# Patient Record
Sex: Female | Born: 1990 | Race: Black or African American | Hispanic: No | Marital: Single | Smoking: Current every day smoker
Health system: Southern US, Community
[De-identification: ages and names within clinical notes are randomized; demographics above are authoritative.]

## PROBLEM LIST (undated history)

## (undated) DIAGNOSIS — J45909 Unspecified asthma, uncomplicated: Secondary | ICD-10-CM

---

## 2015-06-26 ENCOUNTER — Emergency Department (HOSPITAL_COMMUNITY): Payer: Self-pay

## 2015-06-26 ENCOUNTER — Encounter (HOSPITAL_COMMUNITY): Payer: Self-pay | Admitting: Emergency Medicine

## 2015-06-26 ENCOUNTER — Emergency Department (HOSPITAL_COMMUNITY)
Admission: EM | Admit: 2015-06-26 | Discharge: 2015-06-26 | Disposition: A | Discharge status: Home / Self Care | Payer: Self-pay | Attending: Emergency Medicine | Admitting: Emergency Medicine

## 2015-06-26 DIAGNOSIS — A599 Trichomoniasis, unspecified: Secondary | ICD-10-CM

## 2015-06-26 DIAGNOSIS — F17200 Nicotine dependence, unspecified, uncomplicated: Secondary | ICD-10-CM | POA: Insufficient documentation

## 2015-06-26 DIAGNOSIS — Z87440 Personal history of urinary (tract) infections: Secondary | ICD-10-CM | POA: Insufficient documentation

## 2015-06-26 DIAGNOSIS — Z3202 Encounter for pregnancy test, result negative: Secondary | ICD-10-CM | POA: Insufficient documentation

## 2015-06-26 DIAGNOSIS — A5901 Trichomonal vulvovaginitis: Secondary | ICD-10-CM | POA: Insufficient documentation

## 2015-06-26 DIAGNOSIS — R112 Nausea with vomiting, unspecified: Secondary | ICD-10-CM | POA: Insufficient documentation

## 2015-06-26 DIAGNOSIS — M549 Dorsalgia, unspecified: Secondary | ICD-10-CM | POA: Insufficient documentation

## 2015-06-26 DIAGNOSIS — R102 Pelvic and perineal pain: Secondary | ICD-10-CM

## 2015-06-26 LAB — CBC WITH DIFFERENTIAL/PLATELET
BASOS ABS: 0 10*3/uL (ref 0.0–0.1)
BASOS PCT: 0 %
Eosinophils Absolute: 0.1 10*3/uL (ref 0.0–0.7)
Eosinophils Relative: 1 %
HEMATOCRIT: 37.3 % (ref 36.0–46.0)
Hemoglobin: 12.2 g/dL (ref 12.0–15.0)
Lymphocytes Relative: 21 %
Lymphs Abs: 1.7 10*3/uL (ref 0.7–4.0)
MCH: 28.3 pg (ref 26.0–34.0)
MCHC: 32.7 g/dL (ref 30.0–36.0)
MCV: 86.5 fL (ref 78.0–100.0)
MONO ABS: 0.5 10*3/uL (ref 0.1–1.0)
Monocytes Relative: 6 %
NEUTROS ABS: 5.6 10*3/uL (ref 1.7–7.7)
NEUTROS PCT: 72 %
PLATELETS: 251 10*3/uL (ref 150–400)
RBC: 4.31 MIL/uL (ref 3.87–5.11)
RDW: 13.3 % (ref 11.5–15.5)
WBC: 7.8 10*3/uL (ref 4.0–10.5)

## 2015-06-26 LAB — URINE MICROSCOPIC-ADD ON: BACTERIA UA: NONE SEEN

## 2015-06-26 LAB — COMPREHENSIVE METABOLIC PANEL
ALBUMIN: 3.7 g/dL (ref 3.5–5.0)
ALT: 25 U/L (ref 14–54)
AST: 20 U/L (ref 15–41)
Alkaline Phosphatase: 55 U/L (ref 38–126)
Anion gap: 6 (ref 5–15)
BILIRUBIN TOTAL: 0.3 mg/dL (ref 0.3–1.2)
BUN: 7 mg/dL (ref 6–20)
CHLORIDE: 105 mmol/L (ref 101–111)
CO2: 26 mmol/L (ref 22–32)
Calcium: 8.9 mg/dL (ref 8.9–10.3)
Creatinine, Ser: 0.64 mg/dL (ref 0.44–1.00)
GFR calc Af Amer: >60 mL/min (ref >60)
GFR calc non Af Amer: >60 mL/min (ref >60)
GLUCOSE: 89 mg/dL (ref 65–99)
POTASSIUM: 3.7 mmol/L (ref 3.5–5.1)
SODIUM: 137 mmol/L (ref 135–145)
Total Protein: 7 g/dL (ref 6.5–8.1)

## 2015-06-26 LAB — URINALYSIS, ROUTINE W REFLEX MICROSCOPIC
Bilirubin Urine: NEGATIVE
GLUCOSE, UA: NEGATIVE mg/dL
Ketones, ur: NEGATIVE mg/dL
Nitrite: NEGATIVE
Protein, ur: NEGATIVE mg/dL
SPECIFIC GRAVITY, URINE: 1.019 (ref 1.005–1.030)
pH: 7.5 (ref 5.0–8.0)

## 2015-06-26 LAB — WET PREP, GENITAL
SPERM: NONE SEEN
Yeast Wet Prep HPF POC: NONE SEEN

## 2015-06-26 LAB — POC URINE PREG, ED: PREG TEST UR: NEGATIVE

## 2015-06-26 MED ORDER — DOXYCYCLINE HYCLATE 100 MG PO CAPS: 100.0000 mg | ORAL_CAPSULE | Freq: Two times a day (BID) | ORAL | Status: DC

## 2015-06-26 MED ORDER — METRONIDAZOLE 500 MG PO TABS: 500.0000 mg | ORAL_TABLET | Freq: Two times a day (BID) | ORAL | Status: DC

## 2015-06-26 MED ORDER — CEFTRIAXONE SODIUM 250 MG IJ SOLR
250.0000 mg | Freq: Once | INTRAMUSCULAR | Status: AC
  Administered 2015-06-26: 250 mg via INTRAMUSCULAR
  Filled 2015-06-26: qty 250

## 2015-06-26 NOTE — ED Notes (Signed)
Pt sts lowe to mid abd pain into back; pt sts hx of kidney infection and is currently on period but feels worse than menstrual cramps

## 2015-06-26 NOTE — ED Provider Notes (Signed)
CSN: [id number]     Arrival date & time 06/26/15  1116 History   First MD Initiated Contact with Patient 06/26/15 1551     Chief Complaint  Patient presents with  . Abdominal Pain    (Consider location/radiation/quality/duration/timing/severity/associated sxs/prior Treatment) HPI Comments: Patient with history of urinary infection presents with complaint of lower abdominal pain with radiation into her right back starting yesterday. Pain began gradually yesterday and gradually became worse today. It was associated with nausea and vomiting 1 this morning. Patient started her menstrual. 2 days ago, however does not feel like her current pain is similar to previous menstrual cramps. No history of abdominal surgeries. No fevers, diarrhea. No treatments prior to arrival. Onset of symptoms acute. Nothing makes symptoms better or worse. Last sexual activity 5 weeks ago.   Patient is a 24 y.o. female presenting with abdominal pain. The history is provided by the patient.  Abdominal Pain Associated symptoms: nausea, vaginal bleeding and vomiting   Associated symptoms: no chest pain, no cough, no diarrhea, no dysuria, no fever and no sore throat     History reviewed. No pertinent past medical history. History reviewed. No pertinent past surgical history. History reviewed. No pertinent family history. Social History  Substance Use Topics  . Smoking status: Current Every Day Smoker  . Smokeless tobacco: None  . Alcohol Use: Yes   OB History    No data available     Review of Systems  Constitutional: Negative for fever.  HENT: Negative for rhinorrhea and sore throat.   Eyes: Negative for redness.  Respiratory: Negative for cough.   Cardiovascular: Negative for chest pain.  Gastrointestinal: Positive for nausea, vomiting and abdominal pain. Negative for diarrhea.  Genitourinary: Positive for vaginal bleeding. Negative for dysuria.  Musculoskeletal: Positive for back pain. Negative for  myalgias.  Skin: Negative for rash.  Neurological: Negative for headaches.      Allergies  Review of patient's allergies indicates no known allergies.  Home Medications   Prior to Admission medications   Medication Sig Start Date End Date Taking? Authorizing Provider  acetaminophen (TYLENOL) 500 MG tablet Take 500 mg by mouth every 6 (six) hours as needed for moderate pain.   Yes Historical Provider, MD   BP 133/101 mmHg  Pulse 70  Temp(Src) 98.2 F (36.8 C) (Oral)  Resp 18  SpO2 100%   Physical Exam  Constitutional: She appears well-developed and well-nourished.  HENT:  Head: Normocephalic and atraumatic.  Eyes: Conjunctivae are normal. Right eye exhibits no discharge. Left eye exhibits no discharge.  Neck: Normal range of motion. Neck supple.  Cardiovascular: Normal rate, regular rhythm and normal heart sounds.   Pulmonary/Chest: Effort normal and breath sounds normal.  Abdominal: Soft. She exhibits no distension. There is tenderness (Lower abdomen, right greater than left). There is no rebound and no guarding.  No upper abdominal tenderness.  Genitourinary: There is no rash, tenderness or lesion on the right labia. There is no rash, tenderness or lesion on the left labia. Uterus is tender (mild). Cervix exhibits no motion tenderness and no discharge. Right adnexum displays tenderness (tenderness). Right adnexum displays no mass and no fullness. Left adnexum displays no mass, no tenderness and no fullness. There is bleeding in the vagina. No tenderness in the vagina.  Neurological: She is alert.  Skin: Skin is warm and dry.  Psychiatric: She has a normal mood and affect.  Nursing note and vitals reviewed.   ED Course  Procedures (including critical care time) Labs  Review Labs Reviewed  WET PREP, GENITAL - Abnormal; Notable for the following:    Trich, Wet Prep PRESENT (*)    Clue Cells Wet Prep HPF POC PRESENT (*)    WBC, Wet Prep HPF POC MODERATE (*)    All other  components within normal limits  URINALYSIS, ROUTINE W REFLEX MICROSCOPIC (NOT AT [facility]) - Abnormal; Notable for the following:    Color, Urine RED (*)    APPearance TURBID (*)    Hgb urine dipstick LARGE (*)    Leukocytes, UA MODERATE (*)    All other components within normal limits  URINE MICROSCOPIC-ADD ON - Abnormal; Notable for the following:    Squamous Epithelial / LPF 0-5 (*)    All other components within normal limits  CBC WITH DIFFERENTIAL/PLATELET  COMPREHENSIVE METABOLIC PANEL  POC URINE PREG, ED  GC/CHLAMYDIA PROBE AMP ([place]) NOT AT [facility]    Imaging Review US Transvaginal Non-ob  06/26/2015  CLINICAL DATA:  Right adnexal pain for 4 days EXAM: TRANSABDOMINAL AND TRANSVAGINAL ULTRASOUND OF PELVIS TECHNIQUE: Both transabdominal and transvaginal ultrasound examinations of the pelvis were performed. Transabdominal technique was performed for global imaging of the pelvis including uterus, ovaries, adnexal regions, and pelvic cul-de-sac. It was necessary to proceed with endovaginal exam following the transabdominal exam to visualize the uterus, endometrium and ovaries. COMPARISON:  None FINDINGS: Uterus Measurements: 6.5 x 3.7 x 4.5 cm. No fibroids or other mass visualized. Endometrium Thickness:  4.4 mm.  No focal abnormality visualized. Right ovary Measurements: 3.9 x 2.2 x 1.9 cm. Normal appearance/no adnexal mass. Left ovary Measurements: 4.1 x 2.8 x 2.8 cm. Probable corpus luteum is identified within the left ovary measuring 2.3 x 1.2 x 1.4 cm. Normal appearance/no adnexal mass. Other findings No free fluid. IMPRESSION: 1. Left ovary corpus luteum. 2. No findings to explain right adnexal pain. Electronically Signed   By: Signa Kell M.D.   On: 06/26/2015 18:06   US Pelvis Complete  06/26/2015  CLINICAL DATA:  Right adnexal pain for 4 days EXAM: TRANSABDOMINAL AND TRANSVAGINAL ULTRASOUND OF PELVIS TECHNIQUE: Both transabdominal and transvaginal ultrasound examinations of the  pelvis were performed. Transabdominal technique was performed for global imaging of the pelvis including uterus, ovaries, adnexal regions, and pelvic cul-de-sac. It was necessary to proceed with endovaginal exam following the transabdominal exam to visualize the uterus, endometrium and ovaries. COMPARISON:  None FINDINGS: Uterus Measurements: 6.5 x 3.7 x 4.5 cm. No fibroids or other mass visualized. Endometrium Thickness:  4.4 mm.  No focal abnormality visualized. Right ovary Measurements: 3.9 x 2.2 x 1.9 cm. Normal appearance/no adnexal mass. Left ovary Measurements: 4.1 x 2.8 x 2.8 cm. Probable corpus luteum is identified within the left ovary measuring 2.3 x 1.2 x 1.4 cm. Normal appearance/no adnexal mass. Other findings No free fluid. IMPRESSION: 1. Left ovary corpus luteum. 2. No findings to explain right adnexal pain. Electronically Signed   By: Signa Kell M.D.   On: 06/26/2015 18:06   I have personally reviewed and evaluated these images and lab results as part of my medical decision-making.   EKG Interpretation None       4:07 PM Patient seen and examined. Work-up initiated. Offered pain mediations, patient declines.   Vital signs reviewed and are as follows: BP 133/101 mmHg  Pulse 70  Temp(Src) 98.2 F (36.8 C) (Oral)  Resp 18  SpO2 100%  Pelvic exam performed with NT chaperone. [place] ordered given R adnexal tenderness.   7:20 PM patient updated  on pelvic ultrasound results. Wet prep shows Trichomonas. Patient informed and counseled on this. Given mild right-sided adnexal tenderness, cannot rule out pelvic inflammatory disease, although mild. Will treat here with Rocephin. Will give Flagyl and doxycycline for home. Encouraged follow-up with OB/GYN. Referral given.  The patient was urged to return to the Emergency Department immediately with worsening of current symptoms, worsening abdominal pain, persistent vomiting, blood noted in stools, fever, or any other concerns. The patient  verbalized understanding.    MDM   Final diagnoses:  Trichomoniasis   Patient with pelvic pain, right greater than left. With blood cell count is normal. Low suspicion for appendicitis. No free fluid noted on ultrasound. Workup is reassuring. Given adnexal tenderness plus trichomoniasis, will treat for PID. UA shows 0-5 red blood cells, no bacteria, do not suspect urinary tract infection or pyelonephritis.    Renne Crigler, PA-C 06/26/15 [id number]  Dione Booze, MD 06/26/15 [phone]

## 2015-06-26 NOTE — Discharge Instructions (Signed)
Please read and follow all provided instructions.  Your diagnoses today include:  1. Pelvic pain in female   2. Trichomoniasis     Tests performed today include:  Blood counts and electrolytes  Blood tests to check liver and kidney function  Urine test to look for infection and pregnancy (in women)  Ultrasound - normal  Wet prep - shows trichomonas  Vital signs. See below for your results today.   Medications prescribed:   Doxycycline - antibiotic  You have been prescribed an antibiotic medicine: take the entire course of medicine even if you are feeling better. Stopping early can cause the antibiotic not to work.   Metronidazole - antibiotic  You have been prescribed an antibiotic medicine: take the entire course of medicine even if you are feeling better. Stopping early can cause the antibiotic not to work. Do not drink alcohol when taking this medication.   Take any prescribed medications only as directed.  Home care instructions:   Follow any educational materials contained in this packet.  Follow-up instructions: Please follow-up with your OB/GYN for further evaluation of your symptoms.    Return instructions:  SEEK IMMEDIATE MEDICAL ATTENTION IF:  The pain does not go away or becomes severe   A temperature above 101F develops   Repeated vomiting occurs (multiple episodes)   The pain becomes localized to portions of the abdomen. The right side could possibly be appendicitis. In an adult, the left lower portion of the abdomen could be colitis or diverticulitis.   Blood is being passed in stools or vomit (bright red or black tarry stools)   You develop chest pain, difficulty breathing, dizziness or fainting, or become confused, poorly responsive, or inconsolable (young children)  If you have any other emergent concerns regarding your health  Additional Information: Abdominal (belly) pain can be caused by many things. Your caregiver performed an  examination and possibly ordered blood/urine tests and imaging (CT scan, x-rays, ultrasound). Many cases can be observed and treated at home after initial evaluation in the emergency department. Even though you are being discharged home, abdominal pain can be unpredictable. Therefore, you need a repeated exam if your pain does not resolve, returns, or worsens. Most patients with abdominal pain don't have to be admitted to the hospital or have surgery, but serious problems like appendicitis and gallbladder attacks can start out as nonspecific pain. Many abdominal conditions cannot be diagnosed in one visit, so follow-up evaluations are very important.  Your vital signs today were: BP 123/99 mmHg   Pulse 73   Temp(Src) 98.2 F (36.8 C) (Oral)   Resp 18   SpO2 100% If your blood pressure (bp) was elevated above 135/85 this visit, please have this repeated by your doctor within one month. --------------

## 2015-06-27 LAB — GC/CHLAMYDIA PROBE AMP (~~LOC~~) NOT AT ARMC
CHLAMYDIA, DNA PROBE: NEGATIVE
Neisseria Gonorrhea: NEGATIVE

## 2016-06-22 IMAGING — US US TRANSVAGINAL NON-OB
1 series · 14 of 25 positions shown · non-contrast
Comparison: None

CLINICAL DATA: Right adnexal pain for 4 days

EXAM:
TRANSABDOMINAL AND TRANSVAGINAL ULTRASOUND OF PELVIS
TECHNIQUE: Both transabdominal and transvaginal ultrasound examinations of the
pelvis were performed. Transabdominal technique was performed for
global imaging of the pelvis including uterus, ovaries, adnexal
regions, and pelvic cul-de-sac. It was necessary to proceed with
endovaginal exam following the transabdominal exam to visualize the
uterus, endometrium and ovaries..

[Series 1: us transvaginal non-ob · 0.24mm/px · 14 of 86 slices shown]
[im 1/86]
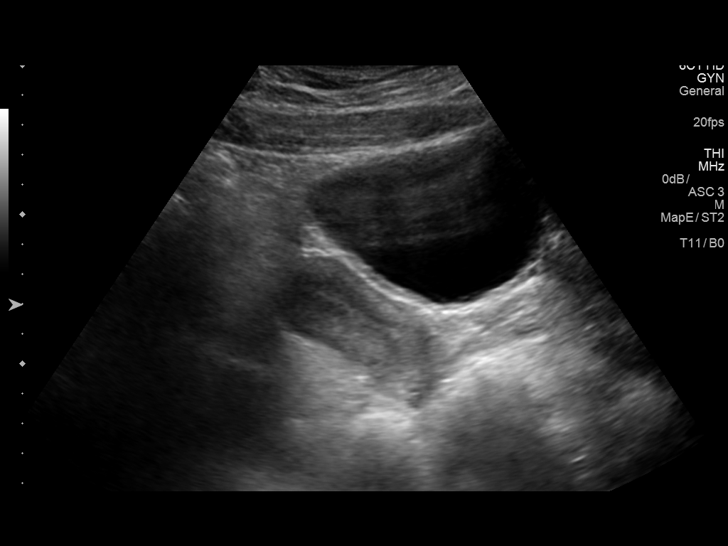
[im 8/86]
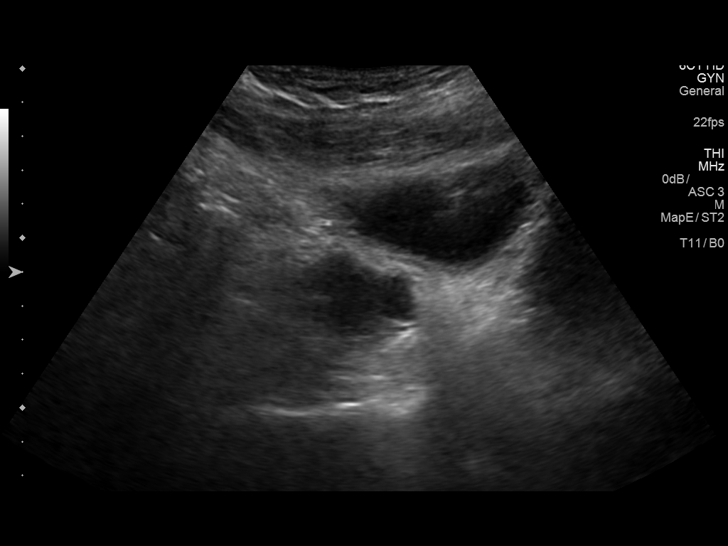
[im 15/86]
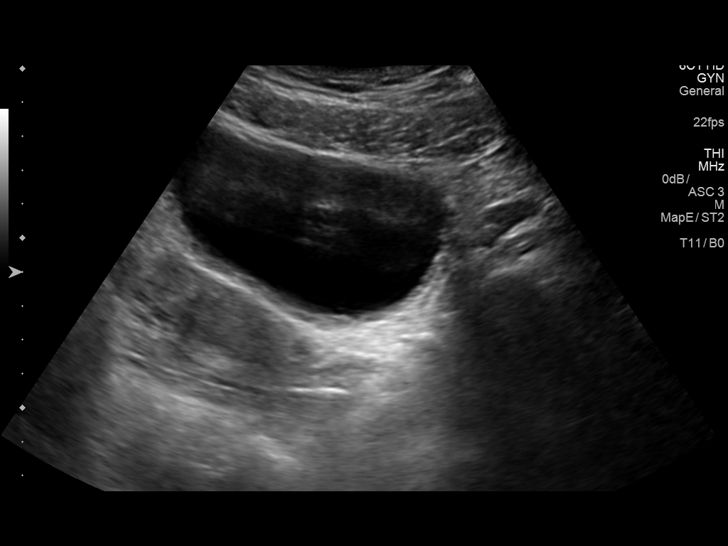
[im 22/86]
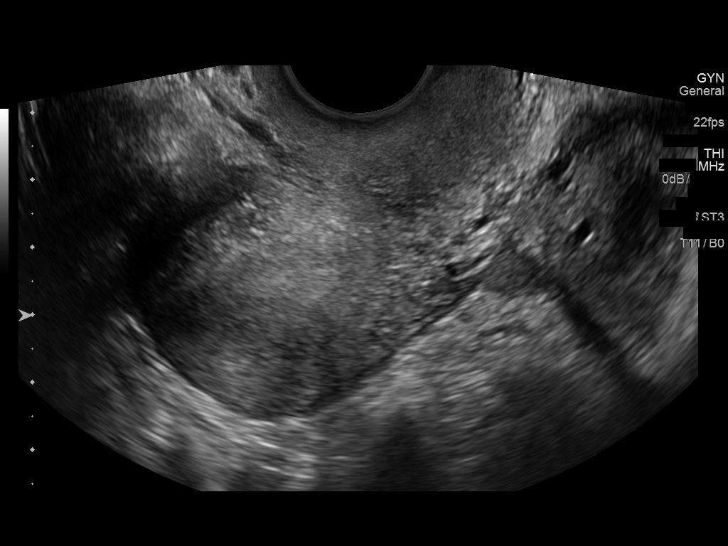
[im 29/86]
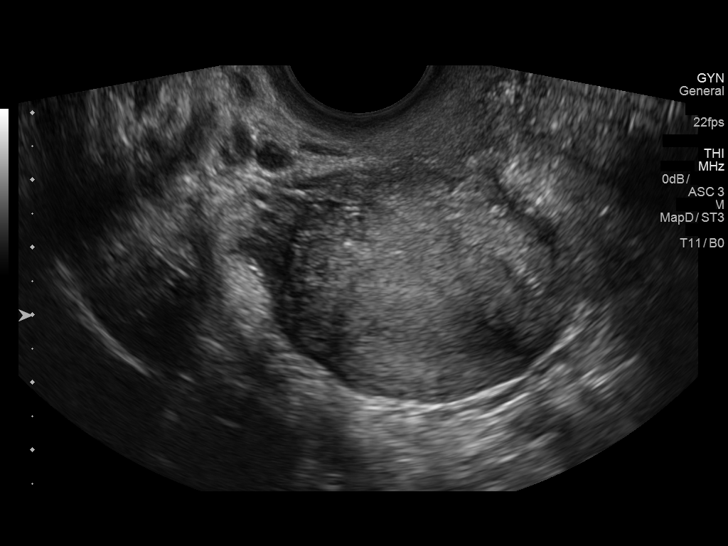
[im 32/86]
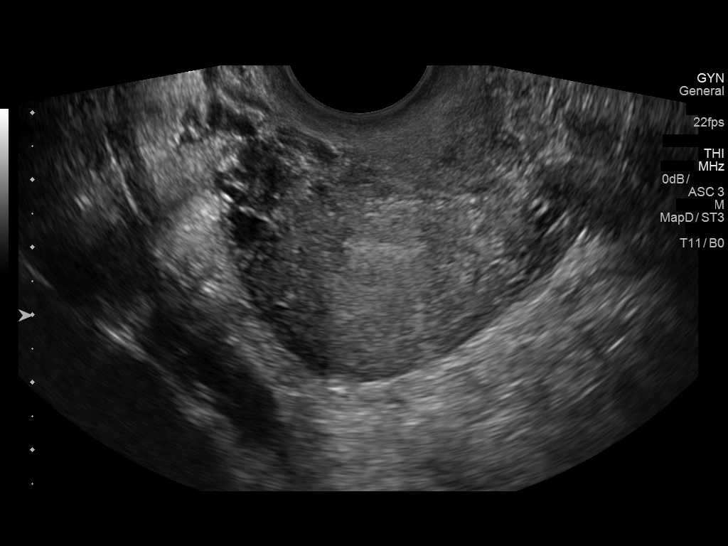
[im 39/86]
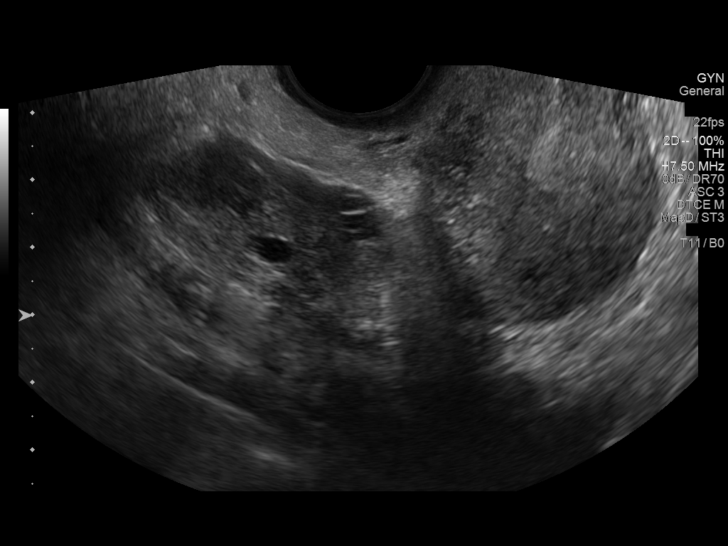
[im 47/86]
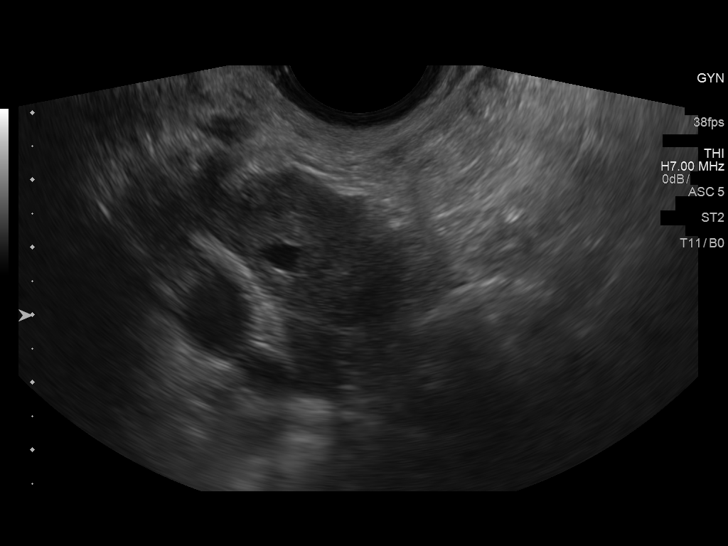
[im 54/86]
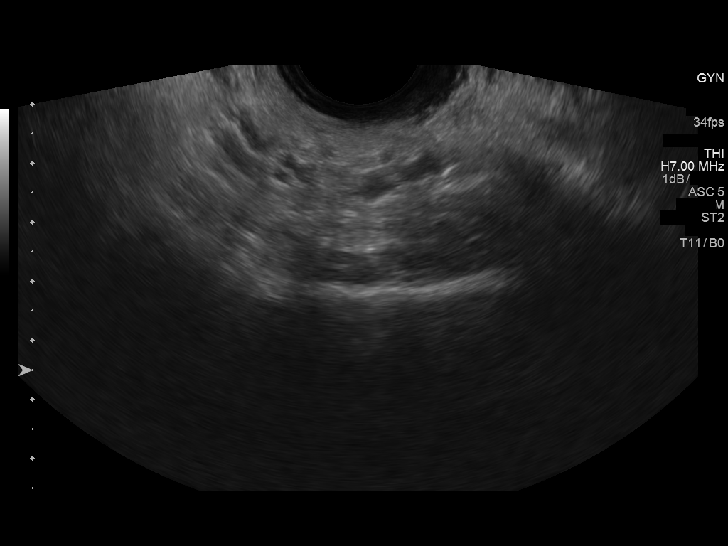
[im 57/86]
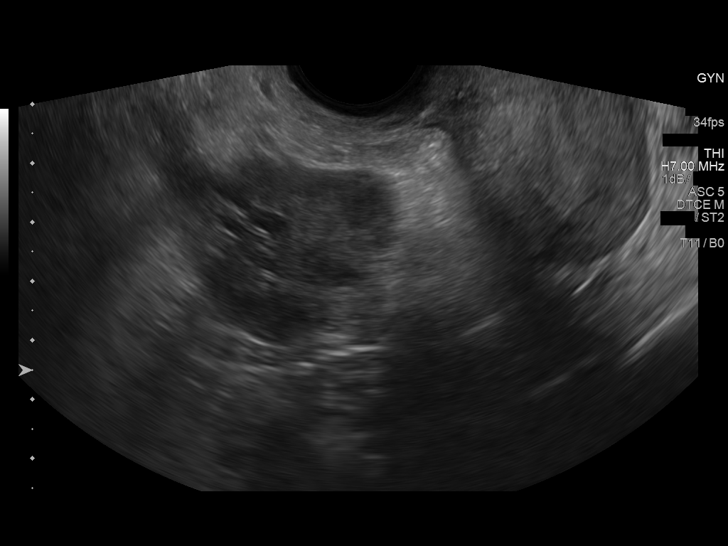
[im 64/86]
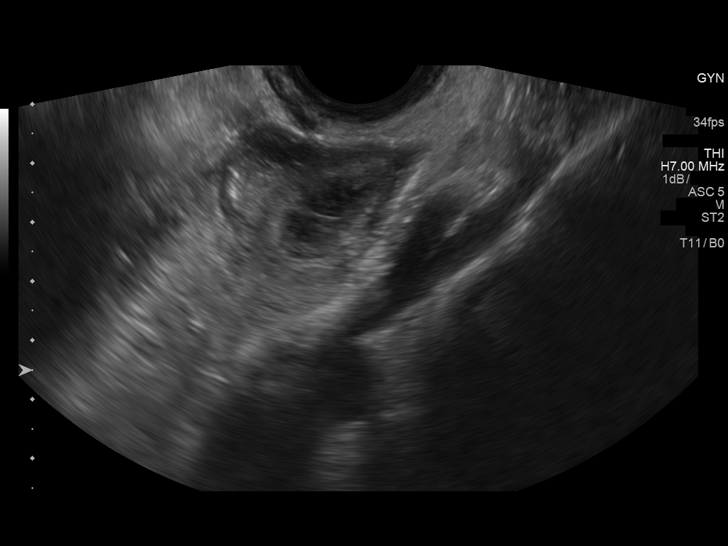
[im 71/86]
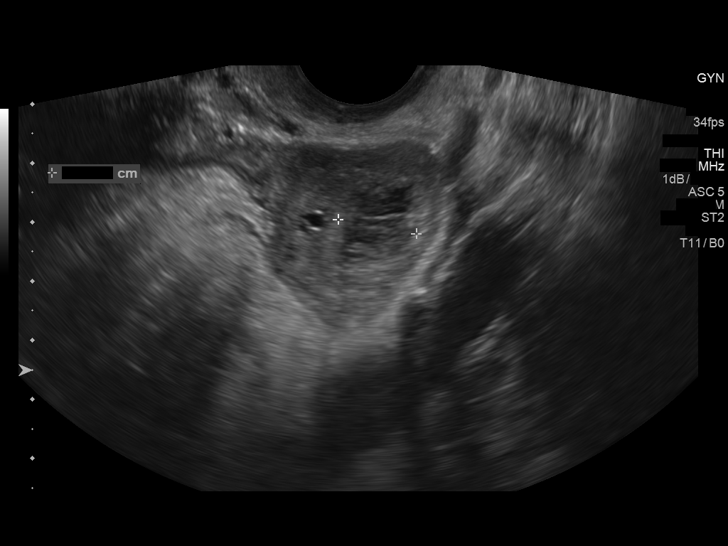
[im 78/86]
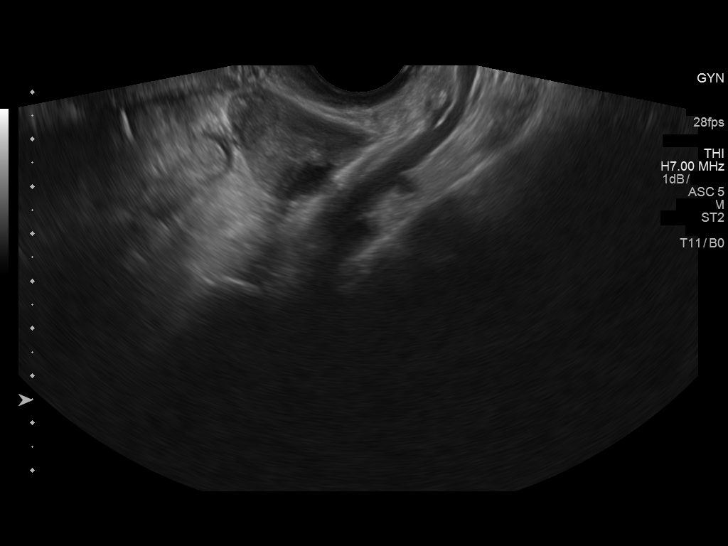
[im 86/86]
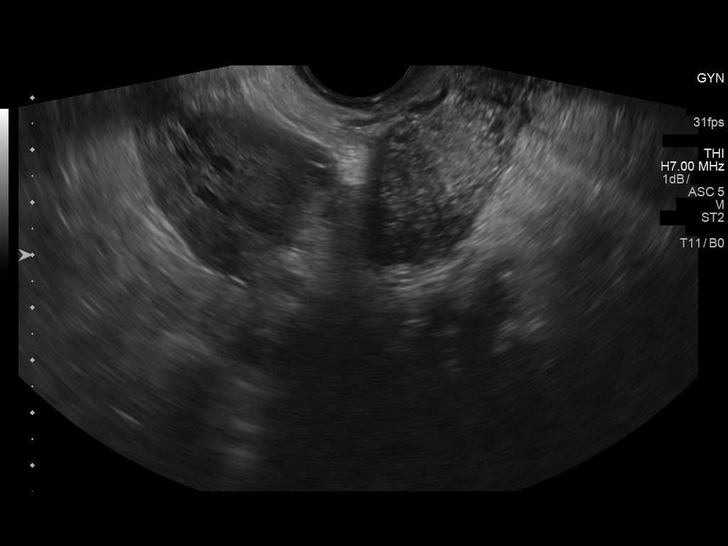

[14 of 25 positions shown; findings below may reference images not displayed]

FINDINGS: Uterus

Measurements: 6.5 x 3.7 x 4.5 cm. No fibroids or other mass
visualized.

Endometrium

Thickness:  4.4 mm.  No focal abnormality visualized.

Right ovary

Measurements: 3.9 x 2.2 x 1.9 cm. Normal appearance/no adnexal mass.

Left ovary

Measurements: 4.1 x 2.8 x 2.8 cm. Probable corpus luteum is
identified within the left ovary measuring 2.3 x 1.2 x 1.4 cm..
Normal appearance/no adnexal mass.

Other findings

No free fluid.
IMPRESSION: 1. Left ovary corpus luteum.
2. No findings to explain right adnexal pain.

## 2020-03-22 ENCOUNTER — Encounter (HOSPITAL_COMMUNITY): Payer: Self-pay

## 2020-03-22 ENCOUNTER — Other Ambulatory Visit: Payer: Self-pay

## 2020-03-22 ENCOUNTER — Emergency Department (HOSPITAL_COMMUNITY)
Admission: EM | Admit: 2020-03-22 | Discharge: 2020-03-22 | Disposition: A | Discharge status: Home / Self Care | Payer: BLUE CROSS/BLUE SHIELD | Attending: Emergency Medicine | Admitting: Emergency Medicine

## 2020-03-22 DIAGNOSIS — S01511A Laceration without foreign body of lip, initial encounter: Secondary | ICD-10-CM | POA: Diagnosis not present

## 2020-03-22 DIAGNOSIS — Y92013 Bedroom of single-family (private) house as the place of occurrence of the external cause: Secondary | ICD-10-CM | POA: Insufficient documentation

## 2020-03-22 DIAGNOSIS — Z23 Encounter for immunization: Secondary | ICD-10-CM | POA: Diagnosis not present

## 2020-03-22 DIAGNOSIS — F17210 Nicotine dependence, cigarettes, uncomplicated: Secondary | ICD-10-CM | POA: Diagnosis not present

## 2020-03-22 DIAGNOSIS — J45909 Unspecified asthma, uncomplicated: Secondary | ICD-10-CM | POA: Diagnosis not present

## 2020-03-22 DIAGNOSIS — S00501A Unspecified superficial injury of lip, initial encounter: Secondary | ICD-10-CM | POA: Diagnosis present

## 2020-03-22 HISTORY — DX: Unspecified asthma, uncomplicated: J45.909

## 2020-03-22 MED ORDER — LIDOCAINE HCL (PF) 1 % IJ SOLN
5.0000 mL | Freq: Once | INTRAMUSCULAR | Status: AC
  Administered 2020-03-22: 5 mL
  Filled 2020-03-22: qty 30

## 2020-03-22 MED ORDER — TETANUS-DIPHTH-ACELL PERTUSSIS 5-2.5-18.5 LF-MCG/0.5 IM SUSP
0.5000 mL | Freq: Once | INTRAMUSCULAR | Status: AC
  Administered 2020-03-22: 0.5 mL via INTRAMUSCULAR
  Filled 2020-03-22: qty 0.5
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
## 2020-03-22 NOTE — Discharge Instructions (Addendum)
Keep area clean and dry.  Recommend soft foods for at least the next week to 2 weeks.  If you develop any significant swelling, redness, drainage, return to ER for wound check.
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
## 2020-09-02 ENCOUNTER — Ambulatory Visit (HOSPITAL_COMMUNITY)
Admission: EM | Admit: 2020-09-02 | Discharge: 2020-09-02 | Disposition: A | Discharge status: Home / Self Care | Payer: HRSA Program | Attending: Family Medicine | Admitting: Family Medicine

## 2020-09-02 ENCOUNTER — Other Ambulatory Visit: Payer: Self-pay

## 2020-09-02 ENCOUNTER — Encounter (HOSPITAL_COMMUNITY): Payer: Self-pay | Admitting: Emergency Medicine

## 2020-09-02 DIAGNOSIS — R110 Nausea: Secondary | ICD-10-CM | POA: Diagnosis not present

## 2020-09-02 DIAGNOSIS — Z20822 Contact with and (suspected) exposure to covid-19: Secondary | ICD-10-CM | POA: Insufficient documentation

## 2020-09-02 DIAGNOSIS — J069 Acute upper respiratory infection, unspecified: Secondary | ICD-10-CM | POA: Insufficient documentation

## 2020-09-02 DIAGNOSIS — R059 Cough, unspecified: Secondary | ICD-10-CM | POA: Insufficient documentation

## 2020-09-02 DIAGNOSIS — F17210 Nicotine dependence, cigarettes, uncomplicated: Secondary | ICD-10-CM | POA: Diagnosis not present

## 2020-09-02 NOTE — ED Provider Notes (Signed)
  [facility]ARE [facility]ENTER   [id number] 09/02/20 Arrival Time: 1250  ASSESSMENT & PLAN:  1. Viral URI with cough     COVID-19 testing sent. See letter/work note on file for self-isolation guidelines. OTC symptom care as needed. May f/u here as needed.   Reviewed expectations re: course of current medical issues. Questions answered. Outlined signs and symptoms indicating need for more acute intervention. Understanding verbalized. After Visit Summary given.   SUBJECTIVE: History from: patient. Jacqueline Wang is a 30 y.o. female who presents with worries regarding COVID-19. Known COVID-19 contact: family member. Recent travel: none. Reports: cough, runny nose, mild nausea without emesis, fatigue; abrupt onset; few d ago. Denies: fever and difficulty breathing. Normal PO intake without n/v/d.    OBJECTIVE:  Vitals:   09/02/20 1344  BP: 136/87  Pulse: 97  Resp: 18  Temp: 98.9 F (37.2 C)  TempSrc: Oral  SpO2: 100%    General appearance: alert; no distress Eyes: PERRLA; EOMI; conjunctiva normal HENT: [place]; AT; with mild nasal congestion Neck: supple  Lungs: speaks full sentences without difficulty; unlabored Extremities: no edema Skin: warm and dry Neurologic: normal gait Psychological: alert and cooperative; normal mood and affect  Labs:  Labs Reviewed  SARS CORONAVIRUS 2 (TAT 6-24 HRS)    No Known Allergies  Past Medical History:  Diagnosis Date  . Asthma    Social History   Socioeconomic History  . Marital status: Single    Spouse name: Not on file  . Number of children: Not on file  . Years of education: Not on file  . Highest education level: Not on file  Occupational History  . Not on file  Tobacco Use  . Smoking status: Current Every Day Smoker    Packs/day: 0.35    Types: Cigarettes  . Smokeless tobacco: Never Used  Vaping Use  . Vaping Use: Never used  Substance and Sexual Activity  . Alcohol use: Yes  . Drug use: Yes    Types: Marijuana   . Sexual activity: Not on file  Other Topics Concern  . Not on file  Social History Narrative  . Not on file   Social Determinants of Health   Financial Resource Strain: Not on file  Food Insecurity: Not on file  Transportation Needs: Not on file  Physical Activity: Not on file  Stress: Not on file  Social Connections: Not on file  Intimate Partner Violence: Not on file   History reviewed. No pertinent family history. History reviewed. No pertinent surgical history.   Mardella Layman, MD 09/02/20 1410

## 2020-09-02 NOTE — Discharge Instructions (Addendum)
You have been tested for COVID-19 today. °If your test returns positive, you will receive a phone call from [facility] regarding your results. °Negative test results are not called. °Both positive and negative results area always visible on MyChart. °If you do not have a MyChart account, sign up instructions are provided in your discharge papers. °Please do not hesitate to contact us should you have questions or concerns. ° °

## 2020-09-02 NOTE — ED Triage Notes (Signed)
Symptoms started 2 days ago.  Complains of cough, runny nose, nausea, and fatigue.  Patient complains of slight sob

## 2020-09-02 NOTE — ED Notes (Signed)
Patient on phone, requesting time to take care of call

## 2020-09-03 LAB — SARS CORONAVIRUS 2 (TAT 6-24 HRS): SARS Coronavirus 2: NEGATIVE

## 2020-11-19 ENCOUNTER — Emergency Department (HOSPITAL_COMMUNITY): Payer: Self-pay

## 2020-11-19 ENCOUNTER — Encounter (HOSPITAL_COMMUNITY): Payer: Self-pay

## 2020-11-19 ENCOUNTER — Emergency Department (HOSPITAL_COMMUNITY)
Admission: EM | Admit: 2020-11-19 | Discharge: 2020-11-19 | Disposition: A | Discharge status: Home / Self Care | Payer: Self-pay | Attending: Emergency Medicine | Admitting: Emergency Medicine

## 2020-11-19 ENCOUNTER — Other Ambulatory Visit: Payer: Self-pay

## 2020-11-19 DIAGNOSIS — N83201 Unspecified ovarian cyst, right side: Secondary | ICD-10-CM

## 2020-11-19 DIAGNOSIS — R1032 Left lower quadrant pain: Secondary | ICD-10-CM

## 2020-11-19 DIAGNOSIS — F17210 Nicotine dependence, cigarettes, uncomplicated: Secondary | ICD-10-CM | POA: Insufficient documentation

## 2020-11-19 DIAGNOSIS — A599 Trichomoniasis, unspecified: Secondary | ICD-10-CM | POA: Insufficient documentation

## 2020-11-19 DIAGNOSIS — J45909 Unspecified asthma, uncomplicated: Secondary | ICD-10-CM | POA: Insufficient documentation

## 2020-11-19 DIAGNOSIS — N83291 Other ovarian cyst, right side: Secondary | ICD-10-CM | POA: Insufficient documentation

## 2020-11-19 LAB — COMPREHENSIVE METABOLIC PANEL
ALT: 23 U/L (ref 0–44)
AST: 16 U/L (ref 15–41)
Albumin: 3.7 g/dL (ref 3.5–5.0)
Alkaline Phosphatase: 57 U/L (ref 38–126)
Anion gap: 6 (ref 5–15)
BUN: 5 mg/dL — ABNORMAL LOW (ref 6–20)
CO2: 24 mmol/L (ref 22–32)
Calcium: 8.9 mg/dL (ref 8.9–10.3)
Chloride: 103 mmol/L (ref 98–111)
Creatinine, Ser: 0.64 mg/dL (ref 0.44–1.00)
GFR, Estimated: >60 mL/min (ref >60)
Glucose, Bld: 84 mg/dL (ref 70–99)
Potassium: 4 mmol/L (ref 3.5–5.1)
Sodium: 133 mmol/L — ABNORMAL LOW (ref 135–145)
Total Bilirubin: 0.3 mg/dL (ref 0.3–1.2)
Total Protein: 7.2 g/dL (ref 6.5–8.1)

## 2020-11-19 LAB — WET PREP, GENITAL
Clue Cells Wet Prep HPF POC: NONE SEEN
Sperm: NONE SEEN
Yeast Wet Prep HPF POC: NONE SEEN

## 2020-11-19 LAB — URINALYSIS, ROUTINE W REFLEX MICROSCOPIC
Bilirubin Urine: NEGATIVE
Glucose, UA: NEGATIVE mg/dL
Hgb urine dipstick: NEGATIVE
Ketones, ur: NEGATIVE mg/dL
Nitrite: NEGATIVE
Protein, ur: NEGATIVE mg/dL
Specific Gravity, Urine: 1.016 (ref 1.005–1.030)
pH: 7 (ref 5.0–8.0)

## 2020-11-19 LAB — I-STAT BETA HCG BLOOD, ED (MC, WL, AP ONLY): I-stat hCG, quantitative: <5 m[IU]/mL (ref <5)

## 2020-11-19 LAB — CBC
HCT: 43.3 % (ref 36.0–46.0)
Hemoglobin: 13.8 g/dL (ref 12.0–15.0)
MCH: 28 pg (ref 26.0–34.0)
MCHC: 31.9 g/dL (ref 30.0–36.0)
MCV: 87.8 fL (ref 80.0–100.0)
Platelets: 207 10*3/uL (ref 150–400)
RBC: 4.93 MIL/uL (ref 3.87–5.11)
RDW: 12.4 % (ref 11.5–15.5)
WBC: 10.3 10*3/uL (ref 4.0–10.5)
nRBC: 0 % (ref 0.0–0.2)

## 2020-11-19 LAB — LIPASE, BLOOD: Lipase: 21 U/L (ref 11–51)

## 2020-11-19 MED ORDER — DOXYCYCLINE HYCLATE 100 MG PO CAPS: 100.0000 mg | ORAL_CAPSULE | Freq: Two times a day (BID) | ORAL | 0 refills | Status: AC

## 2020-11-19 MED ORDER — IBUPROFEN 800 MG PO TABS
800.0000 mg | ORAL_TABLET | Freq: Once | ORAL | Status: AC
  Administered 2020-11-19: 800 mg via ORAL
  Filled 2020-11-19: qty 1

## 2020-11-19 MED ORDER — ACETAMINOPHEN 325 MG PO TABS
650.0000 mg | ORAL_TABLET | Freq: Once | ORAL | Status: AC
  Administered 2020-11-19: 650 mg via ORAL
  Filled 2020-11-19: qty 2

## 2020-11-19 MED ORDER — METRONIDAZOLE 500 MG PO TABS: 500.0000 mg | ORAL_TABLET | Freq: Two times a day (BID) | ORAL | 0 refills | Status: AC

## 2020-11-19 MED ORDER — CEFTRIAXONE SODIUM 500 MG IJ SOLR
500.0000 mg | Freq: Once | INTRAMUSCULAR | Status: AC
  Administered 2020-11-19: 500 mg via INTRAMUSCULAR
  Filled 2020-11-19: qty 500

## 2020-11-19 NOTE — ED Notes (Signed)
Patient transported to ultrasound.

## 2020-11-19 NOTE — ED Triage Notes (Signed)
Pt reports LLQ abd pain x3 days.Pt reports N&V. Pt denies any diarrhea.

## 2020-11-19 NOTE — ED Provider Notes (Signed)
[facility] EMERGENCY DEPARTMENT Provider Note   CSN: [id number] Arrival date & time: 11/19/20  1131     History Chief Complaint  Patient presents with  . Abdominal Pain    Jacqueline Wang is a 30 y.o. female presenting for evaluation of progressive lower abdominal pain.   She reports this initially started with abdominal cramping on Sunday, 5/1, afternoon while she was at work.  She initially attributed this to menstrual cramps as she is due to start her cycle in the next several days, however the pain persisted and became more severe since yesterday.  Squeezing in nature mainly in her suprapubic region, slightly to the right as well.  Constant.  Worse when she lays flat or with eating.  Feeling nauseous due to the discomfort but no emesis.  Has been eating and drinking, however slightly less.  Last bowel movement was on Sunday and was normal for her.  She reports a kidney infection in the past for which she had a similar pain, however that was more severe than current.  Denies any associated fever, dysuria, urinary frequency, or urgency.  Has had change in vaginal discharge since Sunday with increased amount and darker color (more "beige"), however no vaginal itching or irritation.  Denies any form of sexual activity in the past several months.     Past Medical History:  Diagnosis Date  . Asthma     There are no problems to display for this patient.   History reviewed. No pertinent surgical history.   OB History   No obstetric history on file.     History reviewed. No pertinent family history.  Social History   Tobacco Use  . Smoking status: Current Every Day Smoker    Packs/day: 0.35    Types: Cigarettes  . Smokeless tobacco: Never Used  Vaping Use  . Vaping Use: Never used  Substance Use Topics  . Alcohol use: Yes  . Drug use: Yes    Types: Marijuana    Home Medications Prior to Admission medications   Medication Sig Start Date End Date  Taking? Authorizing Provider  doxycycline (VIBRAMYCIN) 100 MG capsule Take 1 capsule (100 mg total) by mouth 2 (two) times daily for 14 days. 11/19/20 12/03/20 Yes Jerred Zaremba N, DO  metroNIDAZOLE (FLAGYL) 500 MG tablet Take 1 tablet (500 mg total) by mouth 2 (two) times daily for 14 days. 11/19/20 12/03/20 Yes Allayne Stack, DO    Allergies    Patient has no known allergies.  Review of Systems   Review of Systems  Constitutional: Negative for fatigue and fever.  Respiratory: Negative for shortness of breath.   Gastrointestinal: Positive for abdominal pain and nausea. Negative for abdominal distention, constipation, diarrhea and vomiting.  Genitourinary: Positive for pelvic pain and vaginal discharge. Negative for dysuria, flank pain, frequency, hematuria, urgency, vaginal bleeding and vaginal pain.  Skin: Negative for rash.    Physical Exam Updated Vital Signs BP (!) 157/97   Pulse 68   Temp 98.9 F (37.2 C)   Resp 18   LMP 10/22/2020   SpO2 100%   Physical Exam Constitutional:      General: She is not in acute distress.    Appearance: She is well-developed. She is not ill-appearing.  HENT:     Head: Normocephalic and atraumatic.  Cardiovascular:     Rate and Rhythm: Normal rate.  Pulmonary:     Effort: Pulmonary effort is normal.  Abdominal:     General: Bowel sounds  are normal.     Tenderness: There is abdominal tenderness in the suprapubic area. There is no right CVA tenderness, left CVA tenderness, guarding or rebound. Negative signs include Rovsing's sign and McBurney's sign.     Hernia: No hernia is present.  Genitourinary:    Vagina: Normal.     Cervix: Cervical motion tenderness and discharge present. No friability.     Uterus: Normal.      Adnexa:        Right: Tenderness present.        Left: No tenderness.       Comments: Moderate amount of white thin discharge present.  Pelvic exam chaperoned by CMA.  GC/CH and wet prep collected. Skin:    General:  Skin is warm and dry.     Capillary Refill: Capillary refill takes less than 2 seconds.  Neurological:     Mental Status: She is alert.     ED Results / Procedures / Treatments   Labs (all labs ordered are listed, but only abnormal results are displayed) Labs Reviewed  WET PREP, GENITAL - Abnormal; Notable for the following components:      Result Value   Trich, Wet Prep PRESENT (*)    WBC, Wet Prep HPF POC MANY (*)    All other components within normal limits  COMPREHENSIVE METABOLIC PANEL - Abnormal; Notable for the following components:   Sodium 133 (*)    BUN 5 (*)    All other components within normal limits  URINALYSIS, ROUTINE W REFLEX MICROSCOPIC - Abnormal; Notable for the following components:   APPearance HAZY (*)    Leukocytes,Ua TRACE (*)    Bacteria, UA RARE (*)    Trichomonas, UA PRESENT (*)    All other components within normal limits  LIPASE, BLOOD  CBC  I-STAT BETA HCG BLOOD, ED ([facility], [facility], [facility] ONLY)  GC/CHLAMYDIA PROBE AMP ([place]) NOT AT [facility]    EKG None  Radiology US PELVIC COMPLETE W TRANSVAGINAL AND TORSION R/O  Result Date: 11/19/2020 CLINICAL DATA:  Intermittent lower abdominal pain 4 4 days. Right adnexal tenderness during exam. EXAM: TRANSABDOMINAL AND TRANSVAGINAL ULTRASOUND OF PELVIS DOPPLER ULTRASOUND OF OVARIES TECHNIQUE: Both transabdominal and transvaginal ultrasound examinations of the pelvis were performed. Transabdominal technique was performed for global imaging of the pelvis including uterus, ovaries, adnexal regions, and pelvic cul-de-sac. It was necessary to proceed with endovaginal exam following the transabdominal exam to visualize the uterus, endometrium and ovaries to better advantage. Color and duplex Doppler ultrasound was utilized to evaluate blood flow to the ovaries. COMPARISON:  None. FINDINGS: Uterus Measurements: 7.0 x 3.6 x 4.4 cm = volume: 58.6 mL. No fibroids or other mass visualized. Endometrium Thickness: 13 mm.  No focal  abnormality visualized. Right ovary Measurements: 5.7 x 3.6 x 3.0 cm = volume: 31.3 mL. Complex appearing cystic area within the ovary measures 2.4 cm in greatest dimension. No other ovary abnormality. Normal blood flow. Left ovary Measurements: 5.2 x 2.8 x 2.1 cm = volume: 16.2 mL. Normal appearance/no adnexal mass. Pulsed Doppler evaluation of both ovaries demonstrates normal low-resistance arterial and venous waveforms. Other findings No abnormal free fluid. IMPRESSION: 1. Small complex appearing cystic lesion/area within the right ovary that may reflect a hemorrhagic cyst, potentially the source of the patient's right adnexal tenderness. Given this lesion's size and patient's age this is considered benign with no specific follow-up recommended. 2. No other abnormality. Normal uterus. No ovarian torsion. No pelvic free fluid. Electronically Signed  By: Amie Portland M.D.   On: 11/19/2020 15:54    Procedures Procedures  Medications Ordered in ED Medications  cefTRIAXone (ROCEPHIN) injection 500 mg (500 mg Intramuscular Given 11/19/20 1638)  acetaminophen (TYLENOL) tablet 650 mg (650 mg Oral Given 11/19/20 1638)  ibuprofen (ADVIL) tablet 800 mg (800 mg Oral Given 11/19/20 1638)    ED Course  I have reviewed the triage vital signs and the nursing notes.  Pertinent labs & imaging results that were available during my care of the patient were reviewed by me and considered in my medical decision making (see chart for details).    MDM Rules/Calculators/A&P                          30 year old female presenting for evaluation of lower abdominal pain and increased vaginal discharge.  Beta-hCG <5.  Labs overall unremarkable with no leukocytosis, mild hyponatremia.  Initial differential including ruptured ovarian cyst, PID/vaginal infection, menstrual cramping, viral colitis, UTI.  Considered appendicitis, however without tenderness in McBurney's point/rebounding/guarding, leukocytosis, fever, or anorexia  suspect this is less likely.  Will perform pelvic exam and pelvic U/S.  U/S showing small right-sided hemorrhagic ovarian cyst without torsion, may be contributing to her pain as it is consistent with the location.  Additionally her pelvic exam was notable for cervical motion tenderness and moderate amount of white vaginal discharge with trichomonas positive, prompting concern for concurrent PID.  U/a more consistent with trichomonas rather than UTI.  She is afebrile/hemodynamically stable without tubo-ovarian abscess, will be appropriate for outpatient PID treatment. Given IM ceftriaxone and Rx'd doxycycline and Flagyl X 14 days.  Given Tylenol/Motrin for pain relief.  ED precautions discussed.  Final Clinical Impression(s) / ED Diagnoses Final diagnoses:  Hemorrhagic cyst of right ovary  Trichimoniasis    Rx / DC Orders ED Discharge Orders         Ordered    metroNIDAZOLE (FLAGYL) 500 MG tablet  2 times daily        11/19/20 1620    doxycycline (VIBRAMYCIN) 100 MG capsule  2 times daily        11/19/20 1620           Allayne Stack, DO 11/19/20 1724    Little, Ambrose Finland, MD 11/22/20 1743

## 2020-11-19 NOTE — ED Notes (Signed)
DC instructions reviewed with pt. Pt verbalized understanding.  Pt DC 

## 2020-11-19 NOTE — Discharge Instructions (Signed)
It was wonderful to see you today.  We believe your abdominal pain is likely from a hemorrhagic cyst on your right ovary and possibly from a vaginal infection.  You do have trichomonas which we have sent in antibiotics to treat.  Additionally received ceftriaxone here and will also have doxycycline/Flagyl sent to your pharmacy with your antibiotics to treat trichomonas and cover for other STDs.  You can take Tylenol 1000 mg every 6 hours as needed up to 4000 mg daily.  Additionally can take ibuprofen 400 to 600 mg every 6 hours as needed.  Heating pad on your belly may help as well.  Please follow-up if the abdominal pain has become more severe, recurrent N/V, or any additional concern.

## 2020-11-20 LAB — GC/CHLAMYDIA PROBE AMP (~~LOC~~) NOT AT ARMC
Chlamydia: NEGATIVE
Comment: NEGATIVE
Comment: NORMAL
Neisseria Gonorrhea: NEGATIVE

## 2022-11-19 ENCOUNTER — Ambulatory Visit: Payer: BLUE CROSS/BLUE SHIELD | Attending: Family Medicine | Admitting: Family Medicine

## 2023-10-29 ENCOUNTER — Encounter (HOSPITAL_COMMUNITY): Payer: Self-pay

## 2023-10-29 ENCOUNTER — Emergency Department (HOSPITAL_COMMUNITY)
Admission: EM | Admit: 2023-10-29 | Discharge: 2023-10-30 | Disposition: A | Discharge status: Home / Self Care | Attending: Emergency Medicine | Admitting: Emergency Medicine

## 2023-10-29 DIAGNOSIS — S81851A Open bite, right lower leg, initial encounter: Secondary | ICD-10-CM | POA: Diagnosis not present

## 2023-10-29 DIAGNOSIS — J45909 Unspecified asthma, uncomplicated: Secondary | ICD-10-CM | POA: Diagnosis not present

## 2023-10-29 DIAGNOSIS — S41151A Open bite of right upper arm, initial encounter: Secondary | ICD-10-CM | POA: Diagnosis present

## 2023-10-29 DIAGNOSIS — S51851A Open bite of right forearm, initial encounter: Secondary | ICD-10-CM | POA: Diagnosis not present

## 2023-10-29 DIAGNOSIS — S71151A Open bite, right thigh, initial encounter: Secondary | ICD-10-CM | POA: Diagnosis not present

## 2023-10-29 DIAGNOSIS — Z23 Encounter for immunization: Secondary | ICD-10-CM | POA: Insufficient documentation

## 2023-10-29 DIAGNOSIS — S71152A Open bite, left thigh, initial encounter: Secondary | ICD-10-CM | POA: Insufficient documentation

## 2023-10-29 DIAGNOSIS — W540XXA Bitten by dog, initial encounter: Secondary | ICD-10-CM | POA: Insufficient documentation

## 2023-10-29 NOTE — ED Triage Notes (Signed)
 As per EMS, patient was bitten by dog on right upper arm, right thigh and left thigh 30 minutes PTA. Patient unsure of animal shot records.

## 2023-10-30 ENCOUNTER — Other Ambulatory Visit: Payer: Self-pay

## 2023-10-30 MED ORDER — AMOXICILLIN-POT CLAVULANATE 875-125 MG PO TABS
1.0000 | ORAL_TABLET | Freq: Once | ORAL | Status: AC
  Administered 2023-10-30: 1 via ORAL
  Filled 2023-10-30: qty 1

## 2023-10-30 MED ORDER — AMOXICILLIN-POT CLAVULANATE 875-125 MG PO TABS: 1.0000 | ORAL_TABLET | Freq: Two times a day (BID) | ORAL | 0 refills | Status: AC

## 2023-10-30 MED ORDER — TETANUS-DIPHTH-ACELL PERTUSSIS 5-2.5-18.5 LF-MCG/0.5 IM SUSY
0.5000 mL | PREFILLED_SYRINGE | Freq: Once | INTRAMUSCULAR | Status: AC
  Administered 2023-10-30: 0.5 mL via INTRAMUSCULAR
  Filled 2023-10-30: qty 0.5

## 2023-10-30 NOTE — Discharge Instructions (Signed)
 Animal control has been contacted regarding the dog bite.  They will monitor the dog for any signs/symptoms suggestive of rabies. Take the prescribed medication as directed.  Keep wounds clean with soap and warm water. Follow-up with your doctor. Return to the ED for new or worsening symptoms.

## 2023-10-30 NOTE — ED Notes (Signed)
 Patient discharged in stable condition.

## 2023-10-30 NOTE — ED Notes (Signed)
Food provided to the pt.

## 2023-10-30 NOTE — ED Provider Notes (Signed)
 [place] EMERGENCY DEPARTMENT AT [facility] Provider Note   CSN: [id number] Arrival date & time: 10/29/23  2354     History  Chief Complaint  Patient presents with   Animal Bite    Jacqueline Wang is a 33 y.o. female.  The history is provided by the patient and medical records.  Animal Bite  33 year old female with history of asthma, presenting to the ED with dog bite.  This occurred from a friend's dog.  She has minor wounds to right upper arm and bilateral thighs.  Last tetanus was 2015.  Unsure of dog's vaccination status.  Has EtOH on board.  Has not been reported to animal control.  Home Medications Prior to Admission medications   Not on File      Allergies    Patient has no known allergies.    Review of Systems   Review of Systems  Skin:  Positive for wound.  All other systems reviewed and are negative.   Physical Exam Updated Vital Signs BP (!) 151/78 (BP Location: Left Arm)   Pulse 98   Temp 98.3 F (36.8 C) (Oral)   Resp 18   Ht 5\' 9"  (1.753 m)   Wt 93 kg   SpO2 100%   BMI 30.27 kg/m   Physical Exam Vitals and nursing note reviewed.  Constitutional:      Appearance: She is well-developed.     Comments: Seems a bit intoxicated  HENT:     Head: Normocephalic and atraumatic.  Eyes:     Conjunctiva/sclera: Conjunctivae normal.     Pupils: Pupils are equal, round, and reactive to light.  Cardiovascular:     Rate and Rhythm: Normal rate and regular rhythm.     Heart sounds: Normal heart sounds.  Pulmonary:     Effort: Pulmonary effort is normal.     Breath sounds: Normal breath sounds.  Abdominal:     General: Bowel sounds are normal.     Palpations: Abdomen is soft.  Musculoskeletal:        General: Normal range of motion.     Cervical back: Normal range of motion.     Comments: Minor abrasions to right upper arm with some surrounding bruising present, right anterior thigh, and left lateral thigh, no active bleeding, no gaping,  no bony deformities  Skin:    General: Skin is warm and dry.  Neurological:     Mental Status: She is alert and oriented to person, place, and time.     ED Results / Procedures / Treatments   Labs (all labs ordered are listed, but only abnormal results are displayed) Labs Reviewed - No data to display  EKG None  Radiology No results found.  Procedures Procedures    Medications Ordered in ED Medications  Tdap (BOOSTRIX) injection 0.5 mL (0.5 mLs Intramuscular Given 10/30/23 0021)  amoxicillin-clavulanate (AUGMENTIN) 875-125 MG per tablet 1 tablet (1 tablet Oral Given 10/30/23 0020)    ED Course/ Medical Decision Making/ A&P                                 Medical Decision Making Risk Prescription drug management.   33 year old female presenting to the ED with dog bite.  This occurred from her friend's dog.  Unsure about vaccination status.  Her last tetanus was 2015.  Patient is intoxicated on exam.  She has superficial wounds to right upper arm, right anterior thigh,  and left lateral thigh.  There is no adipose tissue exposed.  No deep lacerations.  Tetanus was updated.  Given first dose of Augmentin.  Patient reports she attempted to contact friend regarding vaccination status but states she was not able to get ahold of anyone (however then began talking on the phone and asked about "[employer]" shots).  She states she does not want to "rat anyone out".  She is insistent this is not a Geophysical data [employer].  Animal control has been contacted to quarantine dog for close monitoring.  Will continue augmentin.  Encouraged home wound care.  Follow-up with PCP.  Return here for new concerns.  Final Clinical Impression(s) / ED Diagnoses Final diagnoses:  Dog bite, initial encounter    Rx / DC Orders ED Discharge Orders          Ordered    amoxicillin-clavulanate (AUGMENTIN) 875-125 MG tablet  Every 12 hours        10/30/23 0041              Coretha Dew, PA-C 10/30/23  0101    Roberts Ching, MD 10/31/23 0500

## 2024-05-05 ENCOUNTER — Emergency Department (HOSPITAL_COMMUNITY)
Admission: EM | Admit: 2024-05-05 | Discharge: 2024-05-05 | Disposition: A | Discharge status: Home / Self Care | Attending: Emergency Medicine | Admitting: Emergency Medicine

## 2024-05-05 ENCOUNTER — Other Ambulatory Visit: Payer: Self-pay

## 2024-05-05 DIAGNOSIS — J45909 Unspecified asthma, uncomplicated: Secondary | ICD-10-CM | POA: Insufficient documentation

## 2024-05-05 DIAGNOSIS — A09 Infectious gastroenteritis and colitis, unspecified: Secondary | ICD-10-CM | POA: Insufficient documentation

## 2024-05-05 DIAGNOSIS — J069 Acute upper respiratory infection, unspecified: Secondary | ICD-10-CM | POA: Diagnosis not present

## 2024-05-05 DIAGNOSIS — R059 Cough, unspecified: Secondary | ICD-10-CM | POA: Diagnosis present

## 2024-05-05 LAB — RESP PANEL BY RT-PCR (RSV, FLU A&B, COVID)  RVPGX2
Influenza A by PCR: NEGATIVE
Influenza B by PCR: NEGATIVE
Resp Syncytial Virus by PCR: NEGATIVE
SARS Coronavirus 2 by RT PCR: NEGATIVE

## 2024-05-05 NOTE — Discharge Instructions (Signed)
 Thank you for coming to [facility] Emergency Department. You were seen for cough, congestion, diarrhea, and cold/flu-like symptoms. We performed a covid/flu/RSV swab, and you can see your results on your MyChart. Please stay well-hydrated at home. We have prescribed zofran under the tongue every 6-8 hours as needed for nausea/vomiting.  Please follow up with your primary care provider within 1 week if your symptoms do not improve.   Do not hesitate to return to the ED or call 911 if you experience: -Worsening symptoms -Nausea/vomiting so severe you cannot eat/drink anything -Chest pain, shortness of breath -Lightheadedness, passing out -Fevers/chills -Anything else that concerns you

## 2024-05-05 NOTE — ED Triage Notes (Signed)
 Pt. Stated, Ive had congestion, cough, and a couple of days of diarrhea.  I want to make sure Im not contagious

## 2024-05-05 NOTE — ED Provider Notes (Signed)
 [place] EMERGENCY DEPARTMENT AT [facility] Provider Note   CSN: [id number] Arrival date & time: 05/05/24  0809     History  Chief Complaint  Patient presents with   Cough   Nasal Congestion    Kerry-Anne Peel is a 33 y.o. female with PMH as listed below who presents with nasal congestion, cough, and a couple of days of diarrhea. I want to make sure Im not contagious.  Some cough with very mild shortness of breath.  Is coughing up yellow mucus.  Has a history of asthma noted in her chart but states it was as a child and she thinks she outgrew it, no wheezing reported.  Diarrhea for the last 3 days but that has resolved now.  No blood in the diarrhea.  No chest pain.  No fevers at home.  Is eating well today.   Past Medical History:  Diagnosis Date   Asthma        Home Medications Prior to Admission medications   Medication Sig Start Date End Date Taking? Authorizing Provider  amoxicillin -clavulanate (AUGMENTIN ) 875-125 MG tablet Take 1 tablet by mouth every 12 (twelve) hours. 10/30/23   Jarold Olam HERO, PA-C      Allergies    Patient has no known allergies.    Review of Systems   Review of Systems A 10 point review of systems was performed and is negative unless otherwise reported in HPI.  Physical Exam Updated Vital Signs Ht 5' 9 (1.753 m)   Wt 93.4 kg   LMP 04/04/2024   BMI 30.42 kg/m  Physical Exam General: Normal appearing female, lying in bed.  HEENT: Sclera anicteric, MMM, trachea midline.  Cardiology: RRR, no murmurs/rubs/gallops.  Resp: Normal respiratory rate and effort. CTAB, no wheezes, rhonchi, crackles.  Abd: Soft, non-tender, non-distended. No rebound tenderness or guarding.  GU: Deferred. MSK: No peripheral edema or signs of trauma.  Skin: warm, dry.  Neuro: A&Ox4, CNs II-XII grossly intact. MAEs. Sensation grossly intact.  Psych: Normal mood and affect.   ED Results / Procedures / Treatments   Labs (all labs ordered are  listed, but only abnormal results are displayed) Labs Reviewed  RESP PANEL BY RT-PCR (RSV, FLU A&B, COVID)  [id number]    EKG None  Radiology No results found.  Procedures Procedures    Medications Ordered in ED Medications - No data to display  ED Course/ Medical Decision Making/ A&P                          Medical Decision Making   This patient presents to the ED for concern of viral symptoms, this involves an extensive number of treatment options, and is a complaint that carries with it a high risk of complications and morbidity.  I considered the following differential and admission for this acute, potentially life threatening condition.  Patient is overall very well-appearing and hemodynamically stable.  Nontoxic-appearing.  MDM:    This well-appearing patient presents with congestion/cough/diarrhea, likely secondary to viral syndrome vs covid/flu/RSV. Will perform viral testing. Low suspicion for serious bacterial infection such as sepsis/meningitis given nontoxic appearance. Eating/drinking well, low c/f electrolyte derangements. No f/c or productive cough, low c/f PNA. No wheezing to indicate asthma exacerbation.  I performed shared decision making with the patient about a chest x-ray and we agreed to hold off on it for now and if she does not improve or worsens over the next couple days she can come  back to have a chest x-ray performed.  No fever.  Patient is very well-appearing, taking p.o. Patient requested viral swab to make sure she does not have COVID for her job as they requested this of her.  Will be discharged with a plan to follow-up the swab on her MyChart. DC w/ discharge instructions/return precautions. All questions answered to patient's satisfaction.       Labs: I Ordered, and personally interpreted labs.  The pertinent results include: Pending viral swab at the time of discharge  Additional history obtained from chart review  Social Determinants of  Health: Lives independently  Disposition:  DC  Co morbidities that complicate the patient evaluation  Past Medical History:  Diagnosis Date   Asthma      Medicines No orders of the defined types were placed in this encounter.   I have reviewed the patients home medicines and have made adjustments as needed  Problem List / ED Course: Problem List Items Addressed This Visit   None Visit Diagnoses       Viral URI with cough    -  Primary     Diarrhea of infectious origin                       This note was created using dictation software, which may contain spelling or grammatical errors.    Franklyn Sid SAILOR, MD 05/05/24 1350
